# Patient Record
Sex: Female | Born: 1970 | Race: Black or African American | Hispanic: No | Marital: Married
Health system: Southern US, Community
[De-identification: ages and names within clinical notes are randomized; demographics above are authoritative.]

---

## 1998-04-29 ENCOUNTER — Other Ambulatory Visit: Admission: RE | Admit: 1998-04-29 | Discharge: 1998-04-29 | Payer: Self-pay | Admitting: Obstetrics and Gynecology

## 1999-09-28 ENCOUNTER — Other Ambulatory Visit: Admission: RE | Admit: 1999-09-28 | Discharge: 1999-09-28 | Payer: Self-pay | Admitting: Obstetrics and Gynecology

## 2005-10-15 ENCOUNTER — Other Ambulatory Visit: Admission: RE | Admit: 2005-10-15 | Discharge: 2005-10-15 | Payer: Self-pay | Admitting: Obstetrics and Gynecology

## 2006-09-20 ENCOUNTER — Other Ambulatory Visit: Admission: RE | Admit: 2006-09-20 | Discharge: 2006-09-20 | Payer: Self-pay | Admitting: Obstetrics and Gynecology

## 2007-11-04 ENCOUNTER — Other Ambulatory Visit: Admission: RE | Admit: 2007-11-04 | Discharge: 2007-11-04 | Payer: Self-pay | Admitting: Obstetrics and Gynecology

## 2009-01-27 ENCOUNTER — Other Ambulatory Visit: Admission: RE | Admit: 2009-01-27 | Discharge: 2009-01-27 | Payer: Self-pay | Admitting: Obstetrics and Gynecology

## 2010-02-21 ENCOUNTER — Other Ambulatory Visit: Admission: RE | Admit: 2010-02-21 | Discharge: 2010-02-21 | Payer: Self-pay | Admitting: Obstetrics and Gynecology

## 2011-02-21 ENCOUNTER — Other Ambulatory Visit (HOSPITAL_COMMUNITY)
Admission: RE | Admit: 2011-02-21 | Discharge: 2011-02-21 | Disposition: A | Discharge status: Home / Self Care | Payer: PRIVATE HEALTH INSURANCE | Source: Ambulatory Visit | Attending: Obstetrics and Gynecology | Admitting: Obstetrics and Gynecology

## 2011-02-21 ENCOUNTER — Other Ambulatory Visit: Payer: Self-pay | Admitting: Obstetrics and Gynecology

## 2011-02-21 DIAGNOSIS — Z01419 Encounter for gynecological examination (general) (routine) without abnormal findings: Secondary | ICD-10-CM | POA: Insufficient documentation

## 2011-02-21 DIAGNOSIS — Z1159 Encounter for screening for other viral diseases: Secondary | ICD-10-CM | POA: Insufficient documentation

## 2011-03-29 ENCOUNTER — Emergency Department (HOSPITAL_COMMUNITY)
Admission: EM | Admit: 2011-03-29 | Discharge: 2011-03-29 | Disposition: A | Discharge status: Home / Self Care | Payer: No Typology Code available for payment source | Attending: Emergency Medicine | Admitting: Emergency Medicine

## 2011-03-29 ENCOUNTER — Emergency Department (HOSPITAL_COMMUNITY): Payer: No Typology Code available for payment source

## 2011-03-29 DIAGNOSIS — H538 Other visual disturbances: Secondary | ICD-10-CM | POA: Insufficient documentation

## 2011-03-29 DIAGNOSIS — M542 Cervicalgia: Secondary | ICD-10-CM | POA: Insufficient documentation

## 2011-03-29 DIAGNOSIS — I10 Essential (primary) hypertension: Secondary | ICD-10-CM | POA: Insufficient documentation

## 2011-03-29 DIAGNOSIS — Y92410 Unspecified street and highway as the place of occurrence of the external cause: Secondary | ICD-10-CM | POA: Insufficient documentation

## 2011-03-29 DIAGNOSIS — T1490XA Injury, unspecified, initial encounter: Secondary | ICD-10-CM | POA: Insufficient documentation

## 2011-03-29 DIAGNOSIS — R51 Headache: Secondary | ICD-10-CM | POA: Insufficient documentation

## 2012-05-20 ENCOUNTER — Other Ambulatory Visit: Payer: Self-pay | Admitting: Nurse Practitioner

## 2012-05-20 ENCOUNTER — Other Ambulatory Visit (HOSPITAL_COMMUNITY)
Admission: RE | Admit: 2012-05-20 | Discharge: 2012-05-20 | Disposition: A | Discharge status: Home / Self Care | Payer: PRIVATE HEALTH INSURANCE | Source: Ambulatory Visit | Attending: Obstetrics and Gynecology | Admitting: Obstetrics and Gynecology

## 2012-05-20 DIAGNOSIS — N760 Acute vaginitis: Secondary | ICD-10-CM | POA: Insufficient documentation

## 2012-05-20 DIAGNOSIS — Z1151 Encounter for screening for human papillomavirus (HPV): Secondary | ICD-10-CM | POA: Insufficient documentation

## 2012-05-20 DIAGNOSIS — Z01419 Encounter for gynecological examination (general) (routine) without abnormal findings: Secondary | ICD-10-CM | POA: Insufficient documentation

## 2012-05-20 DIAGNOSIS — Z113 Encounter for screening for infections with a predominantly sexual mode of transmission: Secondary | ICD-10-CM | POA: Insufficient documentation

## 2012-06-26 ENCOUNTER — Other Ambulatory Visit: Payer: Self-pay | Admitting: Nurse Practitioner

## 2012-12-30 ENCOUNTER — Other Ambulatory Visit (HOSPITAL_COMMUNITY)
Admission: RE | Admit: 2012-12-30 | Discharge: 2012-12-30 | Disposition: A | Discharge status: Home / Self Care | Payer: BC Managed Care – PPO | Source: Ambulatory Visit | Attending: Obstetrics and Gynecology | Admitting: Obstetrics and Gynecology

## 2012-12-30 ENCOUNTER — Other Ambulatory Visit: Payer: Self-pay | Admitting: Nurse Practitioner

## 2012-12-30 DIAGNOSIS — N760 Acute vaginitis: Secondary | ICD-10-CM | POA: Insufficient documentation

## 2012-12-30 DIAGNOSIS — Z01419 Encounter for gynecological examination (general) (routine) without abnormal findings: Secondary | ICD-10-CM | POA: Insufficient documentation

## 2013-06-01 ENCOUNTER — Other Ambulatory Visit: Payer: Self-pay | Admitting: Obstetrics and Gynecology

## 2013-06-01 DIAGNOSIS — Z1231 Encounter for screening mammogram for malignant neoplasm of breast: Secondary | ICD-10-CM

## 2013-06-22 ENCOUNTER — Ambulatory Visit
Admission: RE | Admit: 2013-06-22 | Discharge: 2013-06-22 | Disposition: A | Discharge status: Home / Self Care | Payer: BC Managed Care – PPO | Source: Ambulatory Visit | Attending: Obstetrics and Gynecology | Admitting: Obstetrics and Gynecology

## 2013-06-22 DIAGNOSIS — Z1231 Encounter for screening mammogram for malignant neoplasm of breast: Secondary | ICD-10-CM

## 2013-06-30 ENCOUNTER — Other Ambulatory Visit: Payer: Self-pay | Admitting: Obstetrics and Gynecology

## 2013-06-30 ENCOUNTER — Other Ambulatory Visit (HOSPITAL_COMMUNITY)
Admission: RE | Admit: 2013-06-30 | Discharge: 2013-06-30 | Disposition: A | Discharge status: Home / Self Care | Payer: BC Managed Care – PPO | Source: Ambulatory Visit | Attending: Obstetrics and Gynecology | Admitting: Obstetrics and Gynecology

## 2013-06-30 DIAGNOSIS — Z113 Encounter for screening for infections with a predominantly sexual mode of transmission: Secondary | ICD-10-CM | POA: Insufficient documentation

## 2013-06-30 DIAGNOSIS — R87810 Cervical high risk human papillomavirus (HPV) DNA test positive: Secondary | ICD-10-CM | POA: Insufficient documentation

## 2013-06-30 DIAGNOSIS — Z01419 Encounter for gynecological examination (general) (routine) without abnormal findings: Secondary | ICD-10-CM | POA: Insufficient documentation

## 2013-06-30 DIAGNOSIS — Z1151 Encounter for screening for human papillomavirus (HPV): Secondary | ICD-10-CM | POA: Insufficient documentation

## 2014-07-01 ENCOUNTER — Other Ambulatory Visit (HOSPITAL_COMMUNITY)
Admission: RE | Admit: 2014-07-01 | Discharge: 2014-07-01 | Disposition: A | Discharge status: Home / Self Care | Payer: BC Managed Care – PPO | Source: Ambulatory Visit | Attending: Obstetrics and Gynecology | Admitting: Obstetrics and Gynecology

## 2014-07-01 ENCOUNTER — Other Ambulatory Visit: Payer: Self-pay | Admitting: Obstetrics and Gynecology

## 2014-07-01 DIAGNOSIS — Z01419 Encounter for gynecological examination (general) (routine) without abnormal findings: Secondary | ICD-10-CM | POA: Insufficient documentation

## 2014-07-02 LAB — CYTOLOGY - PAP

## 2014-09-07 ENCOUNTER — Other Ambulatory Visit: Payer: Self-pay

## 2014-09-07 DIAGNOSIS — Z1231 Encounter for screening mammogram for malignant neoplasm of breast: Secondary | ICD-10-CM

## 2014-09-27 ENCOUNTER — Ambulatory Visit
Admission: RE | Admit: 2014-09-27 | Discharge: 2014-09-27 | Disposition: A | Discharge status: Home / Self Care | Payer: BC Managed Care – PPO | Source: Ambulatory Visit

## 2014-09-27 DIAGNOSIS — Z1231 Encounter for screening mammogram for malignant neoplasm of breast: Secondary | ICD-10-CM

## 2015-04-05 ENCOUNTER — Other Ambulatory Visit: Payer: Self-pay | Admitting: Gastroenterology

## 2015-04-05 DIAGNOSIS — R7989 Other specified abnormal findings of blood chemistry: Secondary | ICD-10-CM

## 2015-04-05 DIAGNOSIS — R945 Abnormal results of liver function studies: Principal | ICD-10-CM

## 2015-04-13 ENCOUNTER — Ambulatory Visit
Admission: RE | Admit: 2015-04-13 | Discharge: 2015-04-13 | Disposition: A | Discharge status: Home / Self Care | Payer: BLUE CROSS/BLUE SHIELD | Source: Ambulatory Visit | Attending: Gastroenterology | Admitting: Gastroenterology

## 2015-04-13 DIAGNOSIS — R945 Abnormal results of liver function studies: Principal | ICD-10-CM

## 2015-04-13 DIAGNOSIS — R7989 Other specified abnormal findings of blood chemistry: Secondary | ICD-10-CM

## 2015-04-18 ENCOUNTER — Other Ambulatory Visit: Payer: Self-pay

## 2015-09-08 ENCOUNTER — Other Ambulatory Visit: Payer: Self-pay

## 2015-09-08 DIAGNOSIS — Z1231 Encounter for screening mammogram for malignant neoplasm of breast: Secondary | ICD-10-CM

## 2015-10-04 ENCOUNTER — Other Ambulatory Visit: Payer: Self-pay | Admitting: Obstetrics and Gynecology

## 2015-10-04 ENCOUNTER — Other Ambulatory Visit (HOSPITAL_COMMUNITY)
Admission: RE | Admit: 2015-10-04 | Discharge: 2015-10-04 | Disposition: A | Discharge status: Home / Self Care | Payer: BLUE CROSS/BLUE SHIELD | Source: Ambulatory Visit | Attending: Obstetrics and Gynecology | Admitting: Obstetrics and Gynecology

## 2015-10-04 ENCOUNTER — Ambulatory Visit
Admission: RE | Admit: 2015-10-04 | Discharge: 2015-10-04 | Disposition: A | Discharge status: Home / Self Care | Payer: BLUE CROSS/BLUE SHIELD | Source: Ambulatory Visit

## 2015-10-04 DIAGNOSIS — Z01419 Encounter for gynecological examination (general) (routine) without abnormal findings: Secondary | ICD-10-CM | POA: Diagnosis present

## 2015-10-04 DIAGNOSIS — Z1231 Encounter for screening mammogram for malignant neoplasm of breast: Secondary | ICD-10-CM

## 2015-10-05 LAB — CYTOLOGY - PAP

## 2016-10-08 ENCOUNTER — Other Ambulatory Visit (HOSPITAL_COMMUNITY)
Admission: RE | Admit: 2016-10-08 | Discharge: 2016-10-08 | Disposition: A | Discharge status: Home / Self Care | Payer: PRIVATE HEALTH INSURANCE | Source: Ambulatory Visit | Attending: Obstetrics and Gynecology | Admitting: Obstetrics and Gynecology

## 2016-10-08 ENCOUNTER — Other Ambulatory Visit: Payer: Self-pay | Admitting: Obstetrics and Gynecology

## 2016-10-08 DIAGNOSIS — Z01419 Encounter for gynecological examination (general) (routine) without abnormal findings: Secondary | ICD-10-CM | POA: Diagnosis present

## 2016-10-08 DIAGNOSIS — Z1151 Encounter for screening for human papillomavirus (HPV): Secondary | ICD-10-CM | POA: Diagnosis present

## 2016-10-11 LAB — CYTOLOGY - PAP
Diagnosis: NEGATIVE
HPV: NOT DETECTED

## 2016-10-24 ENCOUNTER — Other Ambulatory Visit: Payer: Self-pay | Admitting: Obstetrics and Gynecology

## 2016-10-24 DIAGNOSIS — Z1231 Encounter for screening mammogram for malignant neoplasm of breast: Secondary | ICD-10-CM

## 2016-12-03 ENCOUNTER — Inpatient Hospital Stay: Admission: RE | Admit: 2016-12-03 | Payer: BLUE CROSS/BLUE SHIELD | Source: Ambulatory Visit

## 2016-12-24 ENCOUNTER — Ambulatory Visit
Admission: RE | Admit: 2016-12-24 | Discharge: 2016-12-24 | Disposition: A | Discharge status: Home / Self Care | Payer: PRIVATE HEALTH INSURANCE | Source: Ambulatory Visit | Attending: Obstetrics and Gynecology | Admitting: Obstetrics and Gynecology

## 2016-12-24 DIAGNOSIS — Z1231 Encounter for screening mammogram for malignant neoplasm of breast: Secondary | ICD-10-CM

## 2018-01-28 ENCOUNTER — Other Ambulatory Visit: Payer: Self-pay | Admitting: Obstetrics and Gynecology

## 2018-01-28 DIAGNOSIS — Z1231 Encounter for screening mammogram for malignant neoplasm of breast: Secondary | ICD-10-CM

## 2018-02-24 ENCOUNTER — Ambulatory Visit
Admission: RE | Admit: 2018-02-24 | Discharge: 2018-02-24 | Disposition: A | Discharge status: Home / Self Care | Payer: PRIVATE HEALTH INSURANCE | Source: Ambulatory Visit | Attending: Obstetrics and Gynecology | Admitting: Obstetrics and Gynecology

## 2018-02-24 DIAGNOSIS — Z1231 Encounter for screening mammogram for malignant neoplasm of breast: Secondary | ICD-10-CM

## 2018-12-22 ENCOUNTER — Other Ambulatory Visit: Payer: Self-pay | Admitting: Obstetrics and Gynecology

## 2018-12-22 ENCOUNTER — Other Ambulatory Visit (HOSPITAL_COMMUNITY)
Admission: RE | Admit: 2018-12-22 | Discharge: 2018-12-22 | Disposition: A | Discharge status: Home / Self Care | Payer: PRIVATE HEALTH INSURANCE | Source: Ambulatory Visit | Attending: Obstetrics and Gynecology | Admitting: Obstetrics and Gynecology

## 2018-12-22 DIAGNOSIS — Z01419 Encounter for gynecological examination (general) (routine) without abnormal findings: Secondary | ICD-10-CM | POA: Insufficient documentation

## 2018-12-24 LAB — CYTOLOGY - PAP
Diagnosis: NEGATIVE
HPV: NOT DETECTED

## 2020-01-01 ENCOUNTER — Other Ambulatory Visit: Payer: Self-pay | Admitting: Gastroenterology

## 2020-01-01 DIAGNOSIS — K7689 Other specified diseases of liver: Secondary | ICD-10-CM

## 2020-01-07 ENCOUNTER — Ambulatory Visit
Admission: RE | Admit: 2020-01-07 | Discharge: 2020-01-07 | Disposition: A | Discharge status: Home / Self Care | Payer: PRIVATE HEALTH INSURANCE | Source: Ambulatory Visit | Attending: Gastroenterology | Admitting: Gastroenterology

## 2020-01-07 DIAGNOSIS — K7689 Other specified diseases of liver: Secondary | ICD-10-CM

## 2020-12-28 ENCOUNTER — Other Ambulatory Visit: Payer: Self-pay | Admitting: Obstetrics and Gynecology

## 2020-12-28 DIAGNOSIS — Z1231 Encounter for screening mammogram for malignant neoplasm of breast: Secondary | ICD-10-CM

## 2021-02-13 ENCOUNTER — Other Ambulatory Visit: Payer: Self-pay

## 2021-02-13 ENCOUNTER — Ambulatory Visit
Admission: RE | Admit: 2021-02-13 | Discharge: 2021-02-13 | Disposition: A | Discharge status: Home / Self Care | Payer: PRIVATE HEALTH INSURANCE | Source: Ambulatory Visit | Attending: Obstetrics and Gynecology | Admitting: Obstetrics and Gynecology

## 2021-02-13 DIAGNOSIS — Z1231 Encounter for screening mammogram for malignant neoplasm of breast: Secondary | ICD-10-CM

## 2021-08-31 ENCOUNTER — Ambulatory Visit (INDEPENDENT_AMBULATORY_CARE_PROVIDER_SITE_OTHER): Payer: No Typology Code available for payment source

## 2021-08-31 ENCOUNTER — Other Ambulatory Visit: Payer: Self-pay

## 2021-08-31 ENCOUNTER — Other Ambulatory Visit: Payer: Self-pay | Admitting: Sports Medicine

## 2021-08-31 ENCOUNTER — Ambulatory Visit (INDEPENDENT_AMBULATORY_CARE_PROVIDER_SITE_OTHER): Payer: No Typology Code available for payment source | Admitting: Sports Medicine

## 2021-08-31 ENCOUNTER — Encounter: Payer: Self-pay | Admitting: Sports Medicine

## 2021-08-31 DIAGNOSIS — E559 Vitamin D deficiency, unspecified: Secondary | ICD-10-CM | POA: Insufficient documentation

## 2021-08-31 DIAGNOSIS — R7401 Elevation of levels of liver transaminase levels: Secondary | ICD-10-CM | POA: Insufficient documentation

## 2021-08-31 DIAGNOSIS — K5904 Chronic idiopathic constipation: Secondary | ICD-10-CM | POA: Insufficient documentation

## 2021-08-31 DIAGNOSIS — G4700 Insomnia, unspecified: Secondary | ICD-10-CM | POA: Insufficient documentation

## 2021-08-31 DIAGNOSIS — M792 Neuralgia and neuritis, unspecified: Secondary | ICD-10-CM

## 2021-08-31 DIAGNOSIS — M79672 Pain in left foot: Secondary | ICD-10-CM

## 2021-08-31 DIAGNOSIS — Z6831 Body mass index (BMI) 31.0-31.9, adult: Secondary | ICD-10-CM | POA: Insufficient documentation

## 2021-08-31 DIAGNOSIS — Z8249 Family history of ischemic heart disease and other diseases of the circulatory system: Secondary | ICD-10-CM | POA: Insufficient documentation

## 2021-08-31 DIAGNOSIS — I10 Essential (primary) hypertension: Secondary | ICD-10-CM | POA: Insufficient documentation

## 2021-08-31 DIAGNOSIS — K769 Liver disease, unspecified: Secondary | ICD-10-CM | POA: Insufficient documentation

## 2021-08-31 DIAGNOSIS — Z309 Encounter for contraceptive management, unspecified: Secondary | ICD-10-CM | POA: Insufficient documentation

## 2021-08-31 DIAGNOSIS — R87619 Unspecified abnormal cytological findings in specimens from cervix uteri: Secondary | ICD-10-CM | POA: Insufficient documentation

## 2021-08-31 NOTE — Progress Notes (Signed)
Subjective: Leeann Little Jr is a 50 y.o. female patient who presents to office for evaluation of left great toe pain states that there is pain with pressure very tender to touch sharp shooting pain that goes through the toe with light touch or pressure.  Patient denies any history of any injuries denies any swelling redness warmth or states that this pain has been going on for the last month and does not recall any injury that could have started this pain in her left great toe.  Patient reports that the pain is at the tip and the side of the big toe does not radiate to the big toe joint or up the foot or in the ankle.  Patient denies any other pedal complaints at this time.  Patient denies history of hip knee low back problems or any medical issues.  Patient Active Problem List   Diagnosis Date Noted   Body mass index (BMI) 31.0-31.9, adult 08/31/2021   Chronic idiopathic constipation 08/31/2021   Elevated transaminase level 08/31/2021   Encounter for contraceptive management, unspecified 08/31/2021   High blood pressure 08/31/2021   Family history of ischemic heart disease (IHD) 08/31/2021   Insomnia 08/31/2021   Liver disease 08/31/2021   Unspecified abnormal cytological findings in specimens from cervix uteri 08/31/2021   Vitamin D deficiency 08/31/2021    Current Outpatient Medications on File Prior to Visit  Medication Sig Dispense Refill   calcium carbonate (OSCAL) 1500 (600 Ca) MG TABS tablet 1 tablet with meals     cyclobenzaprine (FLEXERIL) 5 MG tablet Take 5 mg by mouth 2 (two) times daily.     DENTA 5000 PLUS 1.1 % CREA dental cream See admin instructions.     ibuprofen (ADVIL) 800 MG tablet Take 800 mg by mouth every 8 (eight) hours as needed.     JUNEL FE 1/20 1-20 MG-MCG tablet Take by mouth.     Olmesartan-amLODIPine-HCTZ 40-10-25 MG TABS Take 1 tablet by mouth daily.     zolpidem (AMBIEN) 10 MG tablet Take 10 mg by mouth daily.     No current facility-administered  medications on file prior to visit.    Allergies  Allergen Reactions   Sulfamethoxazole-Trimethoprim Hives and Swelling    Other reaction(s): swelling/itching    Objective:  General: Alert and oriented x3 in no acute distress  Dermatology: No open lesions bilateral lower extremities, no webspace macerations, no ecchymosis bilateral, all nails x 10 are well manicured.  Vascular: Dorsalis Pedis and Posterior Tibial pedal pulses palpable, Capillary Fill Time 3 seconds,(+) pedal hair growth bilateral, no edema bilateral lower extremities, Temperature gradient within normal limits.  Neurology: Gross sensation intact via light touch bilateral, Protective sensation intact with Semmes Weinstein monofilament to all pedal sites.  There is subjective sharp shooting pain plantar and plantar great toe starting at the level of the interphalangeal joint to the distal tuft of the toe.  Musculoskeletal: Mild tenderness with palpation left great toe.  No obvious bunion deformity however there is mild hallux extensus deformity noted.  Strength within normal limits in all groups bilateral.   Gait: Antalgic gait  Xrays  Left foot   Impression: No acute osseous findings except a little bit of mild hyperextension at the interphalangeal joint at the left great toe.  No fracture or obvious major dislocation.  Assessment and Plan: Problem List Items Addressed This Visit   None Visit Diagnoses     Pain in left foot    -  Primary   Relevant  Orders   DG Foot Complete Left   Neuritis            -Complete examination performed -Xrays reviewed -Discussed treatment options for likely neuritis that left great toe -Recommend patient to get over-the-counter Nervive nerve relief -Advised patient to get over-the-counter capsaicin cream to use as needed for pain to toe -Dispensed toe caps for patient to use when in shoes to prevent rubbing of left great toe especially at the plantar and medial aspect and distal  tuft where there is most sensitivity and pain -Patient to return to office 3 to 4 weeks or sooner if condition worsens.  Asencion Islam, DPM

## 2021-08-31 NOTE — Patient Instructions (Signed)
Nervive nerve relief supplement for your nerves can be purchased OTC at [facility]  Topical Capsicin cream to use as needed for toe pain related to your nerve

## 2021-10-05 ENCOUNTER — Ambulatory Visit: Payer: No Typology Code available for payment source | Admitting: Sports Medicine

## 2021-11-02 ENCOUNTER — Ambulatory Visit: Payer: No Typology Code available for payment source | Admitting: Sports Medicine

## 2022-01-09 ENCOUNTER — Other Ambulatory Visit: Payer: Self-pay | Admitting: Family Medicine

## 2022-01-09 DIAGNOSIS — Z1231 Encounter for screening mammogram for malignant neoplasm of breast: Secondary | ICD-10-CM

## 2022-02-15 ENCOUNTER — Ambulatory Visit
Admission: RE | Admit: 2022-02-15 | Discharge: 2022-02-15 | Disposition: A | Discharge status: Home / Self Care | Payer: No Typology Code available for payment source | Source: Ambulatory Visit | Attending: Family Medicine | Admitting: Family Medicine

## 2022-02-15 ENCOUNTER — Other Ambulatory Visit: Payer: Self-pay

## 2022-02-15 DIAGNOSIS — Z1231 Encounter for screening mammogram for malignant neoplasm of breast: Secondary | ICD-10-CM

## 2022-08-06 IMAGING — MG MM DIGITAL SCREENING BILAT W/ TOMO AND CAD
8 series · 8 of 24 positions shown · non-contrast
Comparison: Previous exam(s).

CLINICAL DATA: Screening.

EXAM:
DIGITAL SCREENING BILATERAL MAMMOGRAM WITH TOMOSYNTHESIS AND CAD
TECHNIQUE: Bilateral screening digital craniocaudal and mediolateral oblique
mammograms were obtained. Bilateral screening digital breast
tomosynthesis was performed. The images were evaluated with
computer-aided detection.

[R CC synth-2D]
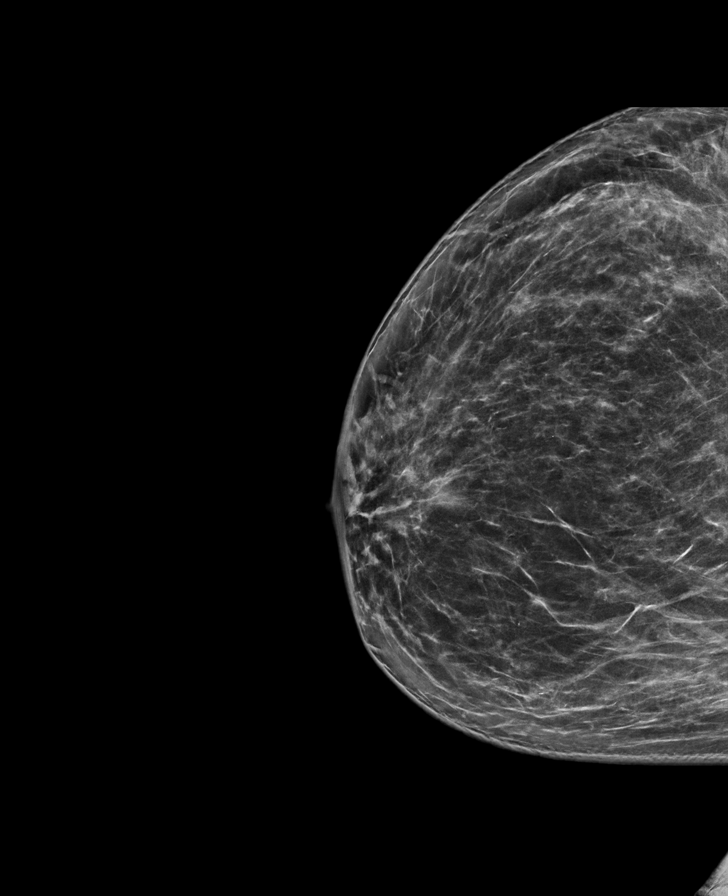

[R MLO synth-2D]
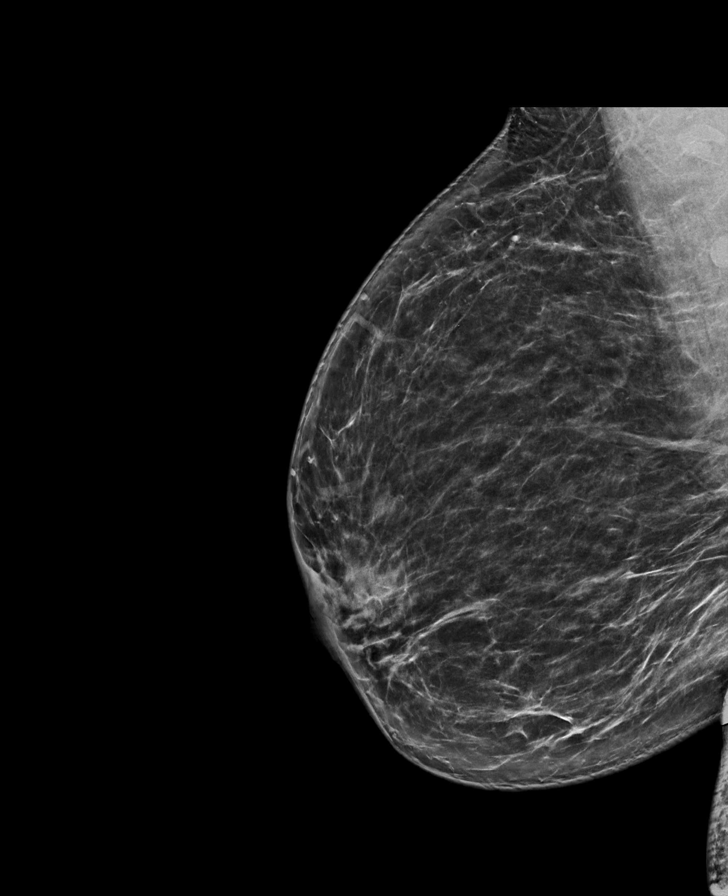

[L MLO synth-2D]
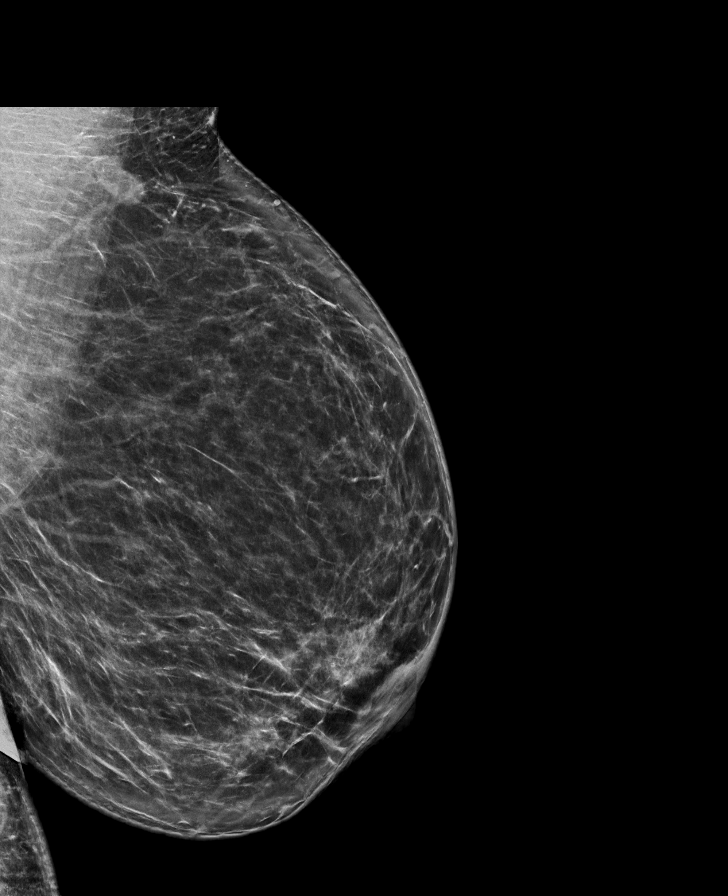

[L CC synth-2D]
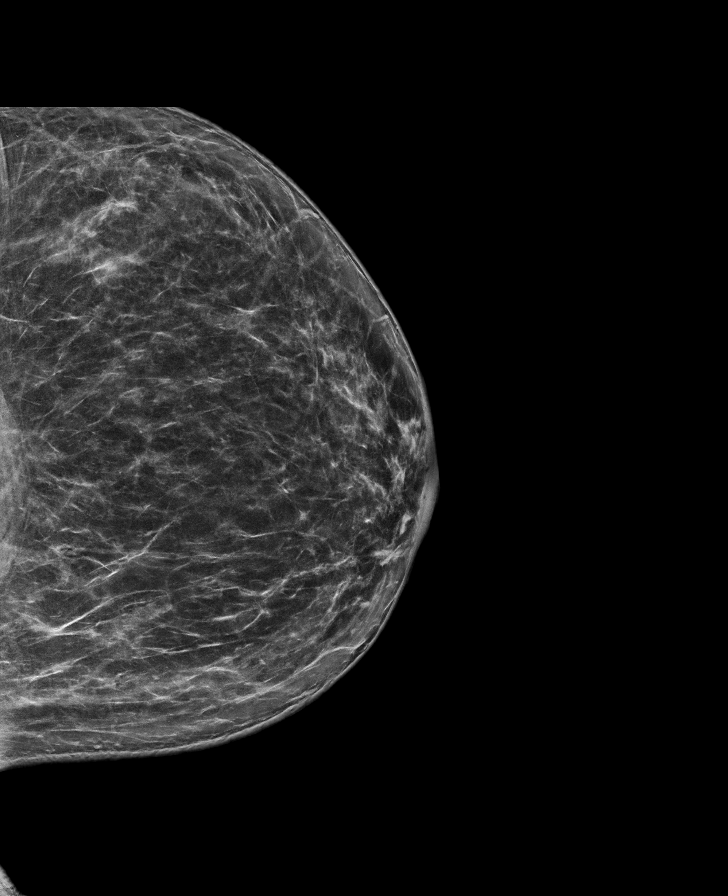

[L MLO tomo · tomo slice 43/84.0]
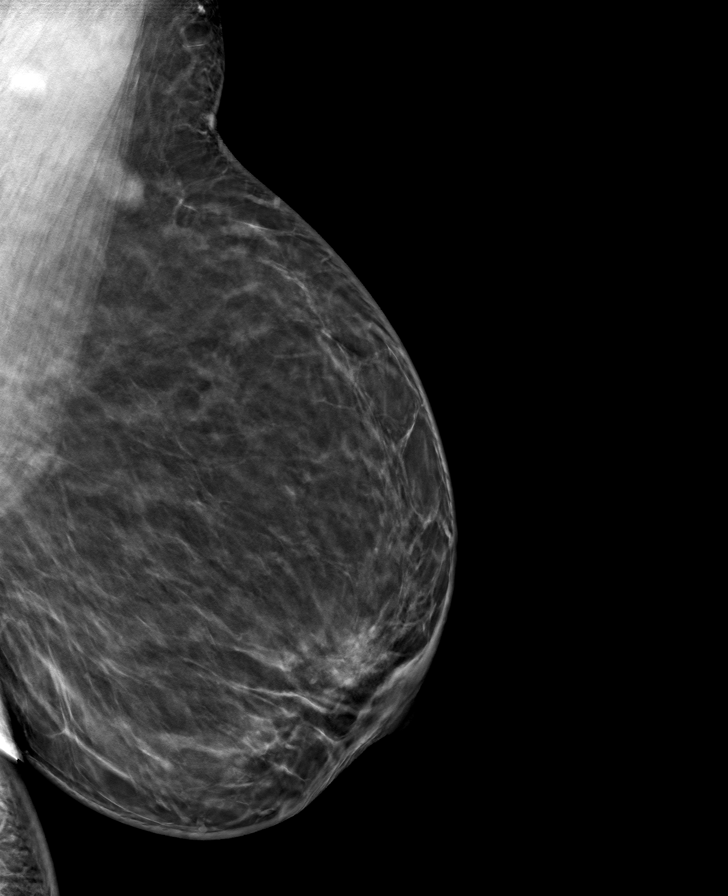

[R CC tomo · tomo slice 40/79.0]
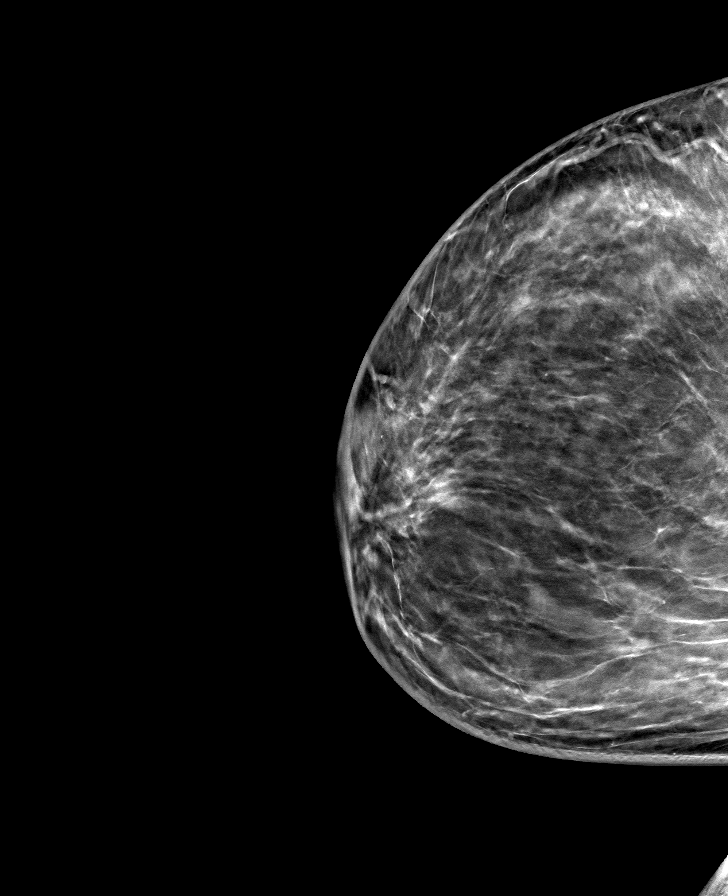

[R MLO tomo · tomo slice 43/86.0]
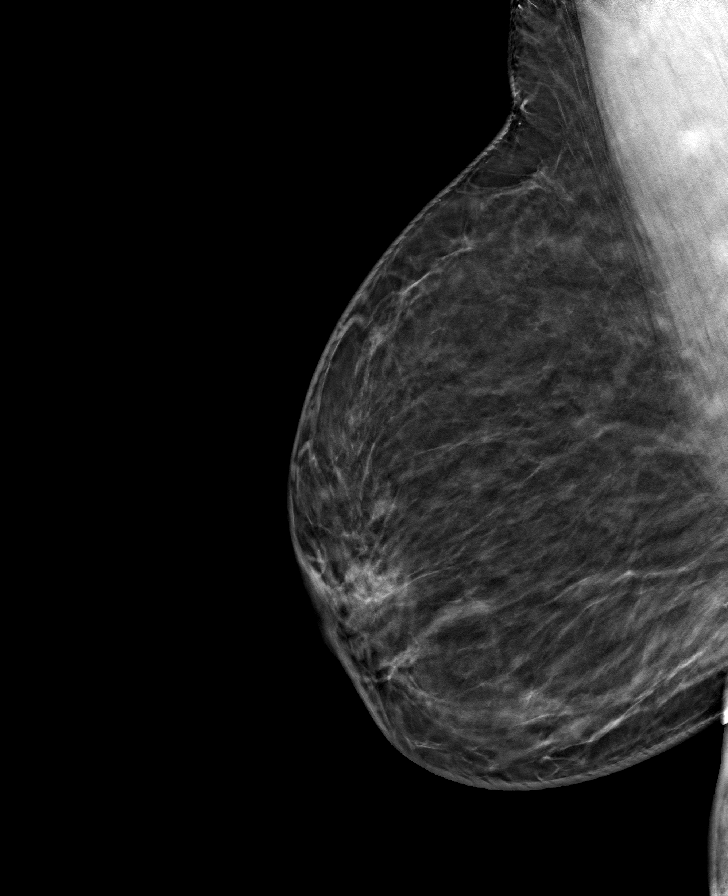

[L CC tomo · tomo slice 41/80.0]
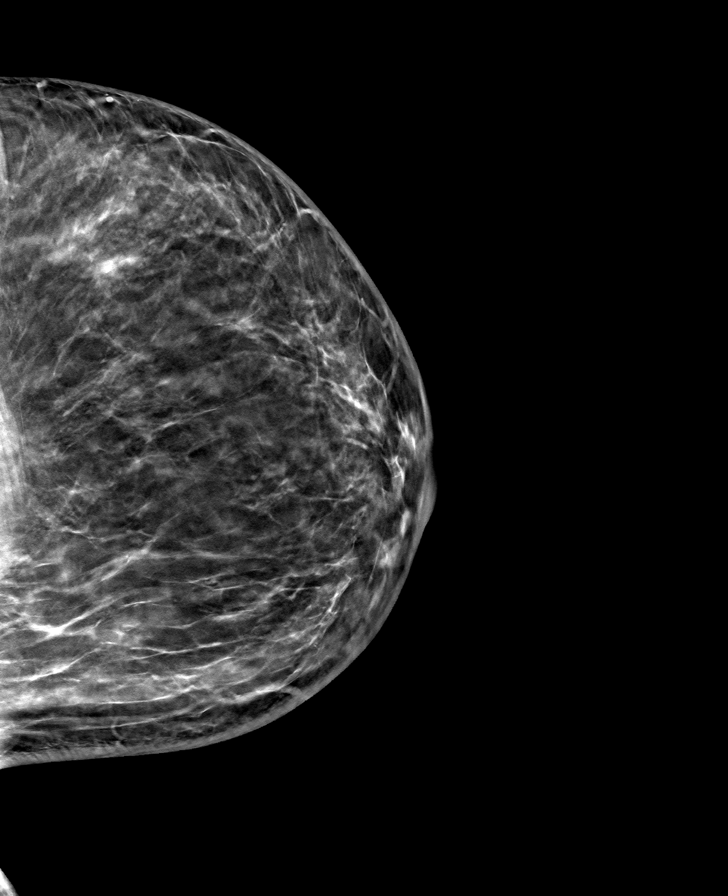

[8 of 24 positions shown; findings below may reference images not displayed]

ACR Breast Density Category b: There are scattered areas of
fibroglandular density.
FINDINGS: There are no findings suspicious for malignancy.
IMPRESSION: No mammographic evidence of malignancy. A result letter of this
screening mammogram will be mailed directly to the patient.

RECOMMENDATION:
Screening mammogram in one year. (Code:51-O-LD2)

BI-RADS CATEGORY  1: Negative.

## 2023-05-06 ENCOUNTER — Other Ambulatory Visit: Payer: Self-pay | Admitting: Family Medicine

## 2023-05-06 DIAGNOSIS — Z1231 Encounter for screening mammogram for malignant neoplasm of breast: Secondary | ICD-10-CM

## 2023-05-28 ENCOUNTER — Ambulatory Visit
Admission: RE | Admit: 2023-05-28 | Discharge: 2023-05-28 | Disposition: A | Discharge status: Home / Self Care | Payer: No Typology Code available for payment source | Source: Ambulatory Visit | Attending: Family Medicine | Admitting: Family Medicine

## 2023-05-28 DIAGNOSIS — Z1231 Encounter for screening mammogram for malignant neoplasm of breast: Secondary | ICD-10-CM

## 2024-06-02 ENCOUNTER — Other Ambulatory Visit: Payer: Self-pay | Admitting: Family Medicine

## 2024-06-02 DIAGNOSIS — Z1231 Encounter for screening mammogram for malignant neoplasm of breast: Secondary | ICD-10-CM

## 2024-08-06 ENCOUNTER — Ambulatory Visit
Admission: RE | Admit: 2024-08-06 | Discharge: 2024-08-06 | Disposition: A | Discharge status: Home / Self Care | Source: Ambulatory Visit | Attending: Family Medicine | Admitting: Family Medicine

## 2024-08-06 DIAGNOSIS — Z1231 Encounter for screening mammogram for malignant neoplasm of breast: Secondary | ICD-10-CM
